# Patient Record
Sex: Female | Born: 1955 | Race: White | Hispanic: No | Marital: Married | Smoking: Former smoker
Health system: Southern US, Community
[De-identification: ages and names within clinical notes are randomized; demographics above are authoritative.]

## PROBLEM LIST (undated history)

## (undated) DIAGNOSIS — H269 Unspecified cataract: Secondary | ICD-10-CM

## (undated) DIAGNOSIS — R011 Cardiac murmur, unspecified: Secondary | ICD-10-CM

## (undated) DIAGNOSIS — K649 Unspecified hemorrhoids: Secondary | ICD-10-CM

## (undated) DIAGNOSIS — K760 Fatty (change of) liver, not elsewhere classified: Secondary | ICD-10-CM

## (undated) DIAGNOSIS — E079 Disorder of thyroid, unspecified: Secondary | ICD-10-CM

## (undated) DIAGNOSIS — I700 Atherosclerosis of aorta: Secondary | ICD-10-CM

## (undated) DIAGNOSIS — G4730 Sleep apnea, unspecified: Secondary | ICD-10-CM

## (undated) DIAGNOSIS — K5790 Diverticulosis of intestine, part unspecified, without perforation or abscess without bleeding: Secondary | ICD-10-CM

## (undated) HISTORY — PX: HYSTEROTOMY: SHX1776

## (undated) HISTORY — DX: Cardiac murmur, unspecified: R01.1

## (undated) HISTORY — PX: NASAL SEPTUM SURGERY: SHX37

## (undated) HISTORY — PX: HERNIA MESH REMOVAL: SHX1745

## (undated) HISTORY — DX: Sleep apnea, unspecified: G47.30

## (undated) HISTORY — PX: HEMORRHOID SURGERY: SHX153

## (undated) HISTORY — PX: SHOULDER ARTHROSCOPY: SHX128

## (undated) HISTORY — DX: Unspecified hemorrhoids: K64.9

## (undated) HISTORY — DX: Unspecified cataract: H26.9

## (undated) HISTORY — PX: CATARACT EXTRACTION, BILATERAL: SHX1313

## (undated) HISTORY — PX: APPENDECTOMY: SHX54

## (undated) HISTORY — PX: CERVICAL FUSION: SHX112

## (undated) HISTORY — DX: Fatty (change of) liver, not elsewhere classified: K76.0

## (undated) HISTORY — DX: Disorder of thyroid, unspecified: E07.9

## (undated) HISTORY — PX: TONSILLECTOMY: SUR1361

## (undated) HISTORY — DX: Diverticulosis of intestine, part unspecified, without perforation or abscess without bleeding: K57.90

## (undated) HISTORY — DX: Atherosclerosis of aorta: I70.0

---

## 2015-05-05 DIAGNOSIS — E063 Autoimmune thyroiditis: Secondary | ICD-10-CM | POA: Insufficient documentation

## 2015-05-05 DIAGNOSIS — I10 Essential (primary) hypertension: Secondary | ICD-10-CM | POA: Insufficient documentation

## 2016-01-01 DIAGNOSIS — G4733 Obstructive sleep apnea (adult) (pediatric): Secondary | ICD-10-CM | POA: Insufficient documentation

## 2017-05-20 DIAGNOSIS — F419 Anxiety disorder, unspecified: Secondary | ICD-10-CM | POA: Insufficient documentation

## 2018-03-26 DIAGNOSIS — R1033 Periumbilical pain: Secondary | ICD-10-CM | POA: Insufficient documentation

## 2018-03-26 DIAGNOSIS — K648 Other hemorrhoids: Secondary | ICD-10-CM | POA: Insufficient documentation

## 2018-03-26 DIAGNOSIS — K6289 Other specified diseases of anus and rectum: Secondary | ICD-10-CM | POA: Insufficient documentation

## 2018-10-13 DIAGNOSIS — M47812 Spondylosis without myelopathy or radiculopathy, cervical region: Secondary | ICD-10-CM | POA: Insufficient documentation

## 2020-01-03 DIAGNOSIS — Z8719 Personal history of other diseases of the digestive system: Secondary | ICD-10-CM | POA: Insufficient documentation

## 2020-01-11 DIAGNOSIS — M5136 Other intervertebral disc degeneration, lumbar region: Secondary | ICD-10-CM | POA: Insufficient documentation

## 2020-02-17 NOTE — Telephone Encounter (Signed)
I would be happy to see her in the office for a routine appointment, thanks

## 2020-02-17 NOTE — Telephone Encounter (Signed)
Left message to call back  

## 2020-04-12 DIAGNOSIS — R109 Unspecified abdominal pain: Secondary | ICD-10-CM

## 2020-04-12 DIAGNOSIS — Z8379 Family history of other diseases of the digestive system: Secondary | ICD-10-CM | POA: Diagnosis not present

## 2020-04-12 DIAGNOSIS — K625 Hemorrhage of anus and rectum: Secondary | ICD-10-CM | POA: Diagnosis not present

## 2020-04-12 DIAGNOSIS — K6289 Other specified diseases of anus and rectum: Secondary | ICD-10-CM

## 2020-04-12 DIAGNOSIS — K760 Fatty (change of) liver, not elsewhere classified: Secondary | ICD-10-CM

## 2020-04-12 DIAGNOSIS — E663 Overweight: Secondary | ICD-10-CM

## 2020-04-12 DIAGNOSIS — Z800 Family history of malignant neoplasm of digestive organs: Secondary | ICD-10-CM

## 2020-04-12 NOTE — Progress Notes (Signed)
HPI :  64 year old female with a history of umbilical hernia repair, internal hemorrhoids/anal fissure, strong family history of IBD and colon cancer, referred by Malva Cogan PA for second opinion regarding rectal pain and abdominal pain.  She states she had an umbilical hernia repair about 3 years ago, and ever since then she has had ongoing abdominal pain from her epigastric to lower abdominal area, but it appears to mostly be around her periumbilical area to lower abdomen.  She states she has some level of discomfort there all the time, it never goes away, but can have fluctuations in the severity of the pain.  It does appear positional at times, sitting up straight can make it feel better.  She did not have any prandial association with her pain.  She does have some belching, denies any pyrosis or reflux symptoms.  No dysphagia.  She eats well.  She does have some discomfort with bowel movements at times.  She denies any NSAID use.  She states her abdomen is quite sensitive to the touch.  She thinks over time her symptoms appear to be getting worse.  She has tried some hyoscyamine but that does not appear to have provided much benefit.  She states she had a remote colonoscopy showing that she had an ulcer in her ileum several years ago but no report of that on file.  I do see that her last colonoscopy was in December 2017 done by Dr. Lorne Skeens in [place], she was noted to have bowel prep, BPPS score of 8, good prep.  She was noted to have external hemorrhoids and perianal skin tags as well as moderate diverticulosis, no polyps.  She was told to repeat the exam in 5 years for screening purposes.  Most recently she had a CT scan in May 2021 as outlined below for this issue, no acute pathology was noted.  She was noted to have fatty liver.  She denies any alcohol use.  She also has been having problems with rectal discomfort ongoing for the past few years.  She states she had surgery for  hemorrhoid several years ago, and then had hemorrhoid banding 2 years ago when symptoms started.  She states this did not help her symptoms at all.  She was told she had anal fissures as well.  She has been using Anusol suppositories as well as nifedipine ointment, she states these have not provided any benefit to her.  She states she has rectal discomfort every time she has a bowel movement.  She has a bowel movement about once to twice per day, she has occasional blood in her stools.  Pain can last up towards hours at a time after bowel movement.  She is getting frustrated with this issue.  Brother with colon cancer, brother with Crohn's disease as well as UC in another brother, colon cancer in maternal grandfather  Colonoscopy 2017--mild external hemorrhoids and perianal skin tags, moderate diverticulosis, otherwise normal colonoscopy, 5-year repeat  EGD 2016--GERD, small hiatal hernia, mild erosive gastritis, otherwise normal EGD, path consistent with chemical gastritis but negative for H. pylori   CT scan abdomen pelvis with IV contrast 02/21/20 - no acute findings, fatty liver, R renal cyst, diverticulosis  Labs show a normal CBC and CMET in 08/2019 (care everywhere)   Past Medical History:  Diagnosis Date  . Aortic atherosclerosis ([place])    CT abd/Pelvis 02-21-20 [facility]  . Diverticulosis    CT abd/Pelvis 02-21-20 [facility]  .  Fatty liver    CT abd/Pelvis 02-21-20 [facility]  . Heart murmur   . Hemorrhoids      Past Surgical History:  Procedure Laterality Date  . APPENDECTOMY    . CATARACT EXTRACTION, BILATERAL    . HEMORRHOID SURGERY    . HERNIA MESH REMOVAL    . HYSTEROTOMY    . NASAL SEPTUM SURGERY    . SHOULDER ARTHROSCOPY    . TONSILLECTOMY     Family History  Problem Relation Age of Onset  . Colon polyps Mother   . Diabetes Mother   . Heart disease Mother   . COPD Father   . Diabetes Father   .  Diabetes Sister   . Cirrhosis Brother   . Crohn's disease Brother   . Diabetes Brother   . Colon cancer Brother        ?  . Colon cancer Maternal Grandmother   . Ulcerative colitis Brother    Social History   Tobacco Use  . Smoking status: Former Research scientist (life sciences)  . Smokeless tobacco: Never Used  Substance Use Topics  . Alcohol use: Not Currently  . Drug use: Never   Current Outpatient Medications  Medication Sig Dispense Refill  . ALPRAZolam (XANAX) 0.5 MG tablet Take 1 tablet by mouth 3 (three) times daily as needed.    . Calcium Carb-Cholecalciferol 500-400 MG-UNIT CHEW Chew 1 tablet by mouth daily.    . Cholecalciferol 50 MCG (2000 UT) CAPS Take 1 capsule by mouth daily.    Mariane Baumgarten Sodium (DSS) 100 MG CAPS Take 1 capsule by mouth as needed.    Marland Kitchen Hyoscyamine Sulfate SL 0.125 MG SUBL Take 1 tablet by mouth every 4 (four) hours as needed.    . Omega 3 1000 MG CAPS Take 1 capsule by mouth daily.    . Sodium Sulfate-Mag Sulfate-KCl (SUTAB) [id number] MG TABS Take 1 kit by mouth once for 1 dose. BIN: [id number] PCN: CN GROUP: [id number] MEMBER ID: [id number];DZH AS CASH 24 tablet 0   No current facility-administered medications for this visit.   Allergies  Allergen Reactions  . Amoxicillin-Pot Clavulanate Itching and Rash  . Clarithromycin Rash  . Doxycycline Nausea Only and Other (See Comments)    Abdominal pain   . Levothyroxine Swelling  . Lisdexamfetamine Dimesylate Swelling    Difficulty swallowing. Allergy could not be confirmed, but patient suspects  . Pentasa [Mesalamine] Other (See Comments)    Chills , aches and neck pain  . Cephalexin Other (See Comments)    Makes me feel like I'm speed  . Sulfa Antibiotics Rash     Review of Systems: All systems reviewed and negative except where noted in HPI.    Recent labs  Reviewed in Care everywhere as above  Physical Exam: BP (!) 152/84   Pulse 64   Ht 5' 1"  (1.549 m)   Wt 177 lb (80.3 kg)   BMI 33.44 kg/m    Constitutional: Pleasant,well-developed, female in no acute distress. HEENT: Normocephalic and atraumatic. Conjunctivae are normal. No scleral icterus. Neck supple.  Cardiovascular: Normal rate, regular rhythm.  Pulmonary/chest: Effort normal and breath sounds normal. No wheezing, rales or rhonchi. Abdominal: Soft, nondistended, focal periumbilical tenderness and to mid lower abdomen, (+) carnett sign  There are no masses palpable.  DRE / Anoscopy - CMA June [place] standby - no fissure, internal hemorrhoids noted, no mass lesions Extremities: no edema Lymphadenopathy: No cervical adenopathy noted. Neurological: Alert and oriented to person place and time. Skin:  Skin is warm and dry. No rashes noted. Psychiatric: Normal mood and affect. Behavior is normal.   ASSESSMENT AND PLAN: 64 year old female here for new patient assessment of the following:  Abdominal pain - as above, chronic ongoing pain since periumbilical hernia repair a few years ago.  She is quite sensitive to touch on exam with a positive Carnett's sign.  Her labs are normal and imaging does not show a clear abnormality.  No obvious evidence of recurrent hernia on exam today.  I suspect she may have neuropathic pain following her surgery (perhaps anterior cutaneous entrapment syndrome).  I discussed what this is with her.  She has multiple sites of pain that are focal and she can localize.  While I discussed that this is the most likely diagnosis for her pain, in light of her rectal pain and bleeding and strong family history of IBD, I did offer her a colonoscopy to ensure no evidence of luminal Crohn's disease.  We discussed risk and benefits that she wanted to proceed. If we do not find any cause for her pain on colonoscopy then I will consider trigger point injection of the abdominal wall versus trial of gabapentin or other pain modulator.  She was in agreement with the plan.  In the interim, she will try some Pepcid to see if this  helps any epigastric component of her symptoms however prior EGD negative, she has not tolerated PPIs in the past, I think PUD and gastritis is probably less likely.  Rectal pain / rectal bleeding - symptoms sound like that of an anal fissure however I could not appreciate an obvious fissure on exam today, and treatment of a suspected fissure in the past has not provided any benefit.  Anoscopy done and she does have internal hemorrhoids however none obviously inflamed.  As above we will proceed with colonoscopy to exclude other causes, if nothing found may empirically give her topical nitroglycerin ointment for possible spasm.  She agreed  Fatty liver - I counseled her on spectrum of fatty liver disease and risks for Karlene Lineman and cirrhosis.  She does not drink alcohol.  Her liver enzymes are normal, encouraged her regarding weight loss which may help prevent complications from this and may also help her abdominal pain as well.  She asked for assistance with this and will refer her to a weight loss clinic.  She can follow-up with [place] once yearly for fatty liver  Randalia Cellar, MD [facility]

## 2020-04-12 NOTE — Patient Instructions (Addendum)
If you are age 66 or older, your body mass index should be between 23-30. Your There is no height or weight on file to calculate BMI. If this is out of the aforementioned range listed, please consider follow up with your Primary Care Provider.  If you are age 29 or younger, your body mass index should be between 19-25. Your There is no height or weight on file to calculate BMI. If this is out of the aformentioned range listed, please consider follow up with your Primary Care Provider.    You have been scheduled for a colonoscopy. Please follow written instructions given to you at your visit today.  Please pick up your prep supplies at the pharmacy within the next 1-3 days. If you use inhalers (even only as needed), please bring them with you on the day of your procedure.  Please purchase the following medications over the counter and take as directed: Pepcid 20 mg: Take as needed   We will refer you to the [facility].  They will contact you to schedule an appointment.    Thank you for entrusting me with your care and for choosing [facility], Dr. Ileene Patrick

## 2020-04-27 DIAGNOSIS — Z800 Family history of malignant neoplasm of digestive organs: Secondary | ICD-10-CM

## 2020-04-27 DIAGNOSIS — Z8379 Family history of other diseases of the digestive system: Secondary | ICD-10-CM

## 2020-04-27 DIAGNOSIS — K5730 Diverticulosis of large intestine without perforation or abscess without bleeding: Secondary | ICD-10-CM | POA: Diagnosis not present

## 2020-04-27 DIAGNOSIS — K5790 Diverticulosis of intestine, part unspecified, without perforation or abscess without bleeding: Secondary | ICD-10-CM | POA: Insufficient documentation

## 2020-04-27 DIAGNOSIS — K625 Hemorrhage of anus and rectum: Secondary | ICD-10-CM | POA: Diagnosis present

## 2020-04-27 DIAGNOSIS — R109 Unspecified abdominal pain: Secondary | ICD-10-CM

## 2020-04-27 DIAGNOSIS — K649 Unspecified hemorrhoids: Secondary | ICD-10-CM

## 2020-04-27 MED ORDER — SODIUM CHLORIDE 0.9 % IV SOLN
500.0000 mL | Freq: Once | INTRAVENOUS | Status: DC
Start: 2020-04-27 — End: 2020-04-27

## 2020-04-27 NOTE — Progress Notes (Signed)
To PACU, VSS. Report to [email or web address] 

## 2020-04-27 NOTE — Patient Instructions (Addendum)
HANDOUTS PROVIDED ON: Diverticulosis, Hemorrhoids and banding for Hemorrhoids  You may resume your previous diet and medication schedule.  Trial an over the counter daily fiber supplement if not yet tried, for hemorrhoids  Thank you for allowing [place] to care for you today!!!   YOU HAD AN ENDOSCOPIC PROCEDURE TODAY AT THE [facility]:   Refer to the procedure report that was given to you for any specific questions about what was found during the examination.  If the procedure report does not answer your questions, please call your gastroenterologist to clarify.  If you requested that your care partner not be given the details of your procedure findings, then the procedure report has been included in a sealed envelope for you to review at your convenience later.  YOU SHOULD EXPECT: Some feelings of bloating in the abdomen. Passage of more gas than usual.  Walking can help get rid of the air that was put into your GI tract during the procedure and reduce the bloating. If you had a lower endoscopy (such as a colonoscopy or flexible sigmoidoscopy) you may notice spotting of blood in your stool or on the toilet paper. If you underwent a bowel prep for your procedure, you may not have a normal bowel movement for a few days.  Please Note:  You might notice some irritation and congestion in your nose or some drainage.  This is from the oxygen used during your procedure.  There is no need for concern and it should clear up in a day or so.  SYMPTOMS TO REPORT IMMEDIATELY:   Following lower endoscopy (colonoscopy or flexible sigmoidoscopy):  Excessive amounts of blood in the stool  Significant tenderness or worsening of abdominal pains  Swelling of the abdomen that is new, acute  Fever of 100F or higher    For urgent or emergent issues, a gastroenterologist can be reached at any hour by calling [phone]. Do not use MyChart messaging for urgent concerns.    DIET:  We do recommend a  small meal at first, but then you may proceed to your regular diet.  Drink plenty of fluids but you should avoid alcoholic beverages for 24 hours.  ACTIVITY:  You should plan to take it easy for the rest of today and you should NOT DRIVE or use heavy machinery until tomorrow (because of the sedation medicines used during the test).    FOLLOW UP: Our staff will call the number listed on your records 48-72 hours following your procedure to check on you and address any questions or concerns that you may have regarding the information given to you following your procedure. If we do not reach you, we will leave a message.  We will attempt to reach you two times.  During this call, we will ask if you have developed any symptoms of COVID 19. If you develop any symptoms (ie: fever, flu-like symptoms, shortness of breath, cough etc.) before then, please call [phone].  If you test positive for Covid 19 in the 2 weeks post procedure, please call and report this information to [place].    If any biopsies were taken you will be contacted by phone or by letter within the next 1-3 weeks.  Please call us at [phone] if you have not heard about the biopsies in 3 weeks.    SIGNATURES/CONFIDENTIALITY: You and/or your care partner have signed paperwork which will be entered into your electronic medical record.  These signatures attest to the fact that that  the information above on your After Visit Summary has been reviewed and is understood.  Full responsibility of the confidentiality of this discharge information lies with you and/or your care-partner.

## 2020-04-27 NOTE — Op Note (Signed)
[facility] Patient Name: Samuella Bruinatricia Coreas Procedure Date: 04/27/2020 10:13 AM MRN: [id number] Endoscopist: Viviann SpareSteven P. Adela LankArmbruster , MD Age: 1968 Referring MD:  Date of Birth: 11/29/1955 Gender: Female Account #: [id number] Procedure:                Colonoscopy Indications:              Abdominal pain, Rectal bleeding, Family history of                            Crohn's disease in a first-degree relative, family                            history of colon cancer Medicines:                Monitored Anesthesia Care Procedure:                Pre-Anesthesia Assessment:                           - Prior to the procedure, a History and Physical                            was performed, and patient medications and                            allergies were reviewed. The patient's tolerance of                            previous anesthesia was also reviewed. The risks                            and benefits of the procedure and the sedation                            options and risks were discussed with the patient.                            All questions were answered, and informed consent                            was obtained. Prior Anticoagulants: The patient has                            taken no previous anticoagulant or antiplatelet                            agents. ASA Grade Assessment: II - A patient with                            mild systemic disease. After reviewing the risks                            and benefits, the patient was deemed in  satisfactory condition to undergo the procedure.                           After obtaining informed consent, the colonoscope                            was passed under direct vision. Throughout the                            procedure, the patient's blood pressure, pulse, and                            oxygen saturations were monitored continuously. The                            Colonoscope was  introduced through the anus and                            advanced to the the terminal ileum, with                            identification of the appendiceal orifice and IC                            valve. The colonoscopy was performed without                            difficulty. The patient tolerated the procedure                            well. The quality of the bowel preparation was                            good. The terminal ileum, ileocecal valve,                            appendiceal orifice, and rectum were photographed. Scope In: 10:19:33 AM Scope Out: 10:38:01 AM Scope Withdrawal Time: 0 hours 13 minutes 34 seconds  Total Procedure Duration: 0 hours 18 minutes 28 seconds  Findings:                 The perianal and digital rectal examinations were                            normal.                           The terminal ileum appeared normal.                           A few small-mouthed diverticula were found in the                            left colon and right colon.  Internal hemorrhoids were found during                            retroflexion, with scarring from prior                            hemorrhoidectomy or banding                           The exam was otherwise without abnormality. No                            polyps. No evidence of Crohn's. No anal fissure Complications:            No immediate complications. Estimated blood loss:                            None. Estimated Blood Loss:     Estimated blood loss: none. Impression:               - The examined portion of the ileum was normal.                           - Diverticulosis in the left colon and in the right                            colon.                           - Internal hemorrhoids.                           - The examination was otherwise normal.                           - No polyps, evidence of IBD, or anal fissure.                           No cause for  abdominal pain on colonoscopy, no                            evidence of IBD. I think rectal bleeding most                            likely due to hemorrhoids, no evidence of fissure.                            Abdominal pain could be neuropathic / abdominal                            wall in etiology, consideration for trigger point                            injection as outlined in clinic to treat. Recommendation:           - Patient has a contact number  available for                            emergencies. The signs and symptoms of potential                            delayed complications were discussed with the                            patient. Return to normal activities tomorrow.                            Written discharge instructions were provided to the                            patient.                           - Resume previous diet.                           - Continue present medications.                           - Trial of daily fiber supplement if not tried yet,                            consideration for banding if bleeding symptoms                            persist                           - Repeat colonoscopy in 5 years for screening                            purposes. Viviann Spare P. Adalyne Lovick, MD 04/27/2020 10:44:33 AM This report has been signed electronically.

## 2020-05-01 NOTE — Telephone Encounter (Signed)
Attempted f/u phone call. No answer, left message.  

## 2020-05-01 NOTE — Telephone Encounter (Signed)
  Follow up Call-  Call back number 04/27/2020  Post procedure Call Back phone  # [phone]  Permission to leave phone message Yes  Some recent data might be hidden     Patient questions:  Do you have a fever, pain , or abdominal swelling? No. Pain Score  0 *  Have you tolerated food without any problems? Yes.    Have you been able to return to your normal activities? Yes.    Do you have any questions about your discharge instructions: Diet   No. Medications  No. Follow up visit  No.  Do you have questions or concerns about your Care? No.  Actions: * If pain score is 4 or above: No action needed, pain <4.  1. Have you developed a fever since your procedure? no  2.   Have you had an respiratory symptoms (SOB or cough) since your procedure? no  3.   Have you tested positive for COVID 19 since your procedure no  4.   Have you had any family members/close contacts diagnosed with the COVID 19 since your procedure?  no   If yes to any of these questions please route to Laverna Peace, RN and Charlett Lango, RN

## 2020-06-08 DIAGNOSIS — K641 Second degree hemorrhoids: Secondary | ICD-10-CM

## 2020-06-08 MED ORDER — DICYCLOMINE HCL 10 MG PO CAPS
10.0000 mg | ORAL_CAPSULE | Freq: Three times a day (TID) | ORAL | 3 refills | Status: DC | PRN
Start: 2020-06-08 — End: 2021-11-22

## 2020-06-08 NOTE — Progress Notes (Signed)
  64 year old female here for a follow-up visit for hemorrhoid banding.  She has had symptoms of hemorrhoids intermittently to include bleeding, irritation and swelling.  She has occasional prolapse.  She has had a remote surgery for hemorrhoid she thinks, and banding a few years ago.  These seem to have helped but symptoms have recurred.  She is using some suppositories at home but symptoms continue to occur periodically.  Colonoscopy done in July showed internal hemorrhoids as the likely cause for her symptoms.  In regards to her abdominal pain, she had been using Levsin as needed which she states calms her down a little bit but she has some blurred vision as a side effect.  She inquires about other options.  We discussed switching Levsin to Bentyl to see if she tolerates that better.  Following the discussion of the risks and benefits of banding she wanted to proceed.    Colonoscopy 04/27/20 - The perianal and digital rectal examinations were normal. - The terminal ileum appeared normal. - A few small-mouthed diverticula were found in the left colon and right colon. - Internal hemorrhoids were found during retroflexion, with scarring from prior hemorrhoidectomy or banding - The exam was otherwise without abnormality. No polyps. No evidence of Crohn's. No anal fissure  PROCEDURE NOTE: The patient presents with symptomatic grade II  hemorrhoids, requesting rubber band ligation of his/her hemorrhoidal disease.  All risks, benefits and alternative forms of therapy were described and informed consent was obtained.   The anorectum was pre-medicated with 0.125% nitroglycerin The decision was made to band the LL internal hemorrhoid, and the [facility] System was used to perform band ligation without complication.  Digital anorectal examination was then performed to assure proper positioning of the band, and to adjust the banded tissue as required.  The patient was discharged home without pain or  other issues.  Dietary and behavioral recommendations were given and along with follow-up instructions.     The patient will return in 2-4 weeks for  follow-up and possible additional banding as required. No complications were encountered and the patient tolerated the procedure well.  Of note, patient has had some chronic abdominal discomfort. Not bothering her as much lately, we have suspected neuropathic / musculoskeletal however levsin does provide some benefit, although she states makes her have blurry vision. Will stop the levsin, see if she tolerates bentyl better. She can follow up for this as needed otherwise.  Glenda Patrick, MD [facility]

## 2020-06-08 NOTE — Patient Instructions (Signed)
If you are age 66 or older, your body mass index should be between 23-30. Your Body mass index is 32.5 kg/m. If this is out of the aforementioned range listed, please consider follow up with your Primary Care Provider.  If you are age 77 or younger, your body mass index should be between 19-25. Your Body mass index is 32.5 kg/m. If this is out of the aformentioned range listed, please consider follow up with your Primary Care Provider.   HEMORRHOID BANDING PROCEDURE    FOLLOW-UP CARE   1. The procedure you have had should have been relatively painless since the banding of the area involved does not have nerve endings and there is no pain sensation.  The rubber band cuts off the blood supply to the hemorrhoid and the band may fall off as soon as 48 hours after the banding (the band may occasionally be seen in the toilet bowl following a bowel movement). You may notice a temporary feeling of fullness in the rectum which should respond adequately to plain Tylenol or Motrin.  2. Following the banding, avoid strenuous exercise that evening and resume full activity the next day.  A sitz bath (soaking in a warm tub) or bidet is soothing, and can be useful for cleansing the area after bowel movements.     3. To avoid constipation, take two tablespoons of natural wheat bran, natural oat bran, flax, Benefiber or any over the counter fiber supplement and increase your water intake to 7-8 glasses daily.    4. Unless you have been prescribed anorectal medication, do not put anything inside your rectum for two weeks: No suppositories, enemas, fingers, etc.  5. Occasionally, you may have more bleeding than usual after the banding procedure.  This is often from the untreated hemorrhoids rather than the treated one.  Dont be concerned if there is a tablespoon or so of blood.  If there is more blood than this, lie flat with your bottom higher than your head and apply an ice pack to the area. If the bleeding  does not stop within a half an hour or if you feel faint, call our office at [phone] or go to the emergency room.  6. Problems are not common; however, if there is a substantial amount of bleeding, severe pain, chills, fever or difficulty passing urine (very rare) or other problems, you should call us at [phone] or report to the nearest emergency room.  7. Do not stay seated continuously for more than 2-3 hours for a day or two after the procedure.  Tighten your buttock muscles 10-15 times every two hours and take 10-15 deep breaths every 1-2 hours.  Do not spend more than a few minutes on the toilet if you cannot empty your bowel; instead re-visit the toilet at a later time.   Your next banding appointment is scheduled for Wednesday, 10-13 at 4:00pm. If you need to reschedule this appointment please call as soon as possible: [phone].  We have sent the following medications to your pharmacy for you to pick up at your convenience: Bentyl 10 mg: Take 1 tablet by mouth every 8 hours as needed   Thank you for entrusting me with your care and for choosing [facility], Dr. Ileene Patrick

## 2020-08-08 DIAGNOSIS — K641 Second degree hemorrhoids: Secondary | ICD-10-CM | POA: Diagnosis not present

## 2020-08-08 NOTE — Patient Instructions (Signed)
If you are age 66 or older, your body mass index should be between 23-30. Your Body mass index is 33.44 kg/m. If this is out of the aforementioned range listed, please consider follow up with your Primary Care Provider.  If you are age 9 or younger, your body mass index should be between 19-25. Your Body mass index is 33.44 kg/m. If this is out of the aformentioned range listed, please consider follow up with your Primary Care Provider.   HEMORRHOID BANDING PROCEDURE    FOLLOW-UP CARE   1. The procedure you have had should have been relatively painless since the banding of the area involved does not have nerve endings and there is no pain sensation.  The rubber band cuts off the blood supply to the hemorrhoid and the band may fall off as soon as 48 hours after the banding (the band may occasionally be seen in the toilet bowl following a bowel movement). You may notice a temporary feeling of fullness in the rectum which should respond adequately to plain Tylenol or Motrin.  2. Following the banding, avoid strenuous exercise that evening and resume full activity the next day.  A sitz bath (soaking in a warm tub) or bidet is soothing, and can be useful for cleansing the area after bowel movements.     3. To avoid constipation, take two tablespoons of natural wheat bran, natural oat bran, flax, Benefiber or any over the counter fiber supplement and increase your water intake to 7-8 glasses daily.    4. Unless you have been prescribed anorectal medication, do not put anything inside your rectum for two weeks: No suppositories, enemas, fingers, etc.  5. Occasionally, you may have more bleeding than usual after the banding procedure.  This is often from the untreated hemorrhoids rather than the treated one.  Don't be concerned if there is a tablespoon or so of blood.  If there is more blood than this, lie flat with your bottom higher than your head and apply an ice pack to the area. If the bleeding  does not stop within a half an hour or if you feel faint, call our office at [phone] or go to the emergency room.  6. Problems are not common; however, if there is a substantial amount of bleeding, severe pain, chills, fever or difficulty passing urine (very rare) or other problems, you should call us at [phone] or report to the nearest emergency room.  7. Do not stay seated continuously for more than 2-3 hours for a day or two after the procedure.  Tighten your buttock muscles 10-15 times every two hours and take 10-15 deep breaths every 1-2 hours.  Do not spend more than a few minutes on the toilet if you cannot empty your bowel; instead re-visit the toilet at a later time.    You are scheduled for a 3rd banding appointment on Thursday, 09-04-20 at 4:00pm   Thank you for entrusting me with your care and for choosing [facility], Dr. Ileene Patrick

## 2020-08-08 NOTE — Progress Notes (Signed)
64 year old female here for a follow-up visit for hemorrhoid banding.  She has had symptoms of hemorrhoids intermittently to include bleeding, irritation and swelling.  She has occasional prolapse.  Colonoscopy done in July showed internal hemorrhoids as the likely cause for her symptoms. LL banded on 06/08/20 - she reported a significant improvement in her symptoms and tolerated it well, she wishes to continue with therapy at this time.   Colonoscopy 04/27/20 - The perianal and digital rectal examinations were normal. - The terminal ileum appeared normal. - A few small-mouthed diverticula were found in the left colon and right colon. - Internal hemorrhoids were found during retroflexion, with scarring from prior hemorrhoidectomy or banding - The exam was otherwise without abnormality. No polyps. No evidence of Crohn's. No anal fissure  PROCEDURE NOTE: The patient presents with symptomatic grade II  hemorrhoids, requesting rubber band ligation of his/her hemorrhoidal disease. All risks, benefits and alternative forms of therapy were described and informed consent was obtained.   The anorectum was pre-medicated with 0.125% nitroglycerin The decision was made to band the RP internal hemorrhoid, and the [facility] O'Regan System was used to perform band ligation without complication. Digital anorectal examination was then performed to assure proper positioning of the band, and to adjust the banded tissue as required.  The patient was discharged home without pain or other issues. Dietary and behavioral recommendations were given and along with follow-up instructions.    The patient will return in 2-4 weeks for  follow-up and possible additional banding as required. No complications were encountered and the patient tolerated the procedure well.  Ileene Patrick, MD [facility]

## 2020-09-14 DIAGNOSIS — K641 Second degree hemorrhoids: Secondary | ICD-10-CM | POA: Diagnosis not present

## 2020-09-14 NOTE — Patient Instructions (Signed)
If you are age 66 or older, your body mass index should be between 23-30. Your Body mass index is 33.2 kg/m. If this is out of the aforementioned range listed, please consider follow up with your Primary Care Provider.  If you are age 82 or younger, your body mass index should be between 19-25. Your Body mass index is 33.2 kg/m. If this is out of the aformentioned range listed, please consider follow up with your Primary Care Provider.   HEMORRHOID BANDING PROCEDURE    FOLLOW-UP CARE   1. The procedure you have had should have been relatively painless since the banding of the area involved does not have nerve endings and there is no pain sensation.  The rubber band cuts off the blood supply to the hemorrhoid and the band may fall off as soon as 48 hours after the banding (the band may occasionally be seen in the toilet bowl following a bowel movement). You may notice a temporary feeling of fullness in the rectum which should respond adequately to plain Tylenol or Motrin.  2. Following the banding, avoid strenuous exercise that evening and resume full activity the next day.  A sitz bath (soaking in a warm tub) or bidet is soothing, and can be useful for cleansing the area after bowel movements.     3. To avoid constipation, take two tablespoons of natural wheat bran, natural oat bran, flax, Benefiber or any over the counter fiber supplement and increase your water intake to 7-8 glasses daily.    4. Unless you have been prescribed anorectal medication, do not put anything inside your rectum for two weeks: No suppositories, enemas, fingers, etc.  5. Occasionally, you may have more bleeding than usual after the banding procedure.  This is often from the untreated hemorrhoids rather than the treated one.  Don't be concerned if there is a tablespoon or so of blood.  If there is more blood than this, lie flat with your bottom higher than your head and apply an ice pack to the area. If the bleeding  does not stop within a half an hour or if you feel faint, call our office at [phone] or go to the emergency room.  6. Problems are not common; however, if there is a substantial amount of bleeding, severe pain, chills, fever or difficulty passing urine (very rare) or other problems, you should call us at [phone] or report to the nearest emergency room.  7. Do not stay seated continuously for more than 2-3 hours for a day or two after the procedure.  Tighten your buttock muscles 10-15 times every two hours and take 10-15 deep breaths every 1-2 hours.  Do not spend more than a few minutes on the toilet if you cannot empty your bowel; instead re-visit the toilet at a later time.      Thank you for entrusting me with your care and for choosing [facility], Dr. Ileene Patrick

## 2020-09-14 NOTE — Progress Notes (Signed)
64 year old female here for a follow-up visit for hemorrhoid banding. She has had symptoms of hemorrhoids intermittently to include bleeding, irritation and swelling. She has occasional prolapse. Colonoscopy done in July showed internal hemorrhoids as the likely cause for her symptoms. LL banded on 06/08/20 and  RP banded on 11/9 - she reported a significant improvement in her symptoms and tolerated it well, she wishes to continue with therapy at this time after discussion of risks / benefits.  Colonoscopy 04/27/20 -The perianal and digital rectal examinations were normal. - The terminal ileum appeared normal. - A few small-mouthed diverticula were found in the left colon and right colon. - Internal hemorrhoids were found during retroflexion, with scarring from prior hemorrhoidectomy or banding - The exam was otherwise without abnormality. No polyps. No evidence of Crohn's. No anal fissure  PROCEDURE NOTE: The patient presents with symptomatic gradeIIhemorrhoids, requesting rubber band ligation of his/her hemorrhoidal disease.All risks, benefits and alternative forms of therapy were described and informed consent was obtained.   The anorectum was pre-medicated with0.125% nitroglycerin The decision was made to band theRAinternal hemorrhoid, and the [facility] O'Regan System was used to perform band ligation without complication. Digital anorectal examination was then performed to assure proper positioning of the band, and to adjust the banded tissue as required. The patient was discharged home without pain or other issues.Dietary and behavioral recommendations were given and along with follow-up instructions.  The patient will return as needed moving forward. No additional plans for banding at this time. No complications were encountered and the patient tolerated the procedure well.  Glenda Patrick, MD [facility]

## 2021-09-03 DIAGNOSIS — R1031 Right lower quadrant pain: Secondary | ICD-10-CM

## 2021-09-03 DIAGNOSIS — R102 Pelvic and perineal pain: Secondary | ICD-10-CM

## 2021-09-03 DIAGNOSIS — G8929 Other chronic pain: Secondary | ICD-10-CM | POA: Diagnosis not present

## 2021-09-03 NOTE — Progress Notes (Signed)
Subjective:    Patient ID: Glenda Sanchez, female    DOB: May 03, 1956, 65 y.o.   MRN: [id number]  HPI Glenda Sanchez is a pleasant 65 year old white female, established with Dr. Havery Moros, with history of internal hemorrhoids, diverticulosis, family history of colon cancer, previous umbilical hernia repair and hepatic steatosis. She was last seen in July 2021.  She underwent colonoscopy July 2021 with finding of a few diverticuli in the left and right colon, and internal hemorrhoids, no polyps noted and indicated for 5-year interval follow-up. She had 3 hemorrhoidal banding sessions summer 2021 as well. She comes in today after an acute episode onset on Thursday of last week with what felt like rectal and pelvic pressure with urgency for bowel movements but says she was unable to pass any stool.  She describes severe pain/discomfort with this episode.  She eventually manually removed some stool.  Since that time she is continued to have lower abdominal pain, some pain into the left mid quadrant and left lower quadrant, sensation of rectal and pelvic pressure.  She has been able to have bowel movements, she may see no scant amount of blood on the stool x1.  No associated fever or chills.  She has noted some mild increase in lower abdominal discomfort postprandially.  She feels some pressure on her bladder but no urgency frequency or hematuria.  She has never had similar symptoms in the past.  Review of Systems.Pertinent positive and negative review of systems were noted in the above HPI section.  All other review of systems was otherwise negative.   Outpatient Encounter Medications as of 09/03/2021  Medication Sig   Calcium Carb-Cholecalciferol 500-400 MG-UNIT CHEW Chew 1 tablet by mouth daily.   Cholecalciferol 50 MCG (2000 UT) CAPS Take 1 capsule by mouth daily.   ciprofloxacin (CIPRO) 500 MG tablet Take 1 tablet (500 mg total) by mouth 2 (two) times daily for 10 days.   dicyclomine (BENTYL)  10 MG capsule Take 1 capsule (10 mg total) by mouth every 8 (eight) hours as needed for spasms.   metroNIDAZOLE (FLAGYL) 500 MG tablet Take 1 tablet (500 mg total) by mouth 2 (two) times daily for 10 days.   Omega 3 1000 MG CAPS Take 1 capsule by mouth daily.   No facility-administered encounter medications on file as of 09/03/2021.   Allergies  Allergen Reactions   Amoxicillin-Pot Clavulanate Itching and Rash   Clarithromycin Rash   Doxycycline Nausea Only and Other (See Comments)    Abdominal pain    Levothyroxine Swelling   Lisdexamfetamine Dimesylate Swelling    Difficulty swallowing. Allergy could not be confirmed, but patient suspects   Pentasa [Mesalamine] Other (See Comments)    Chills , aches and neck pain   Cephalexin Other (See Comments)    Makes me feel like I'm speed   Sulfa Antibiotics Rash   There are no problems to display for this patient.  Social History   Socioeconomic History   Marital status: Married    Spouse name: Not on file   Number of children: Not on file   Years of education: Not on file   Highest education level: Not on file  Occupational History   Not on file  Tobacco Use   Smoking status: Former   Smokeless tobacco: Never  Vaping Use   Vaping Use: Never used  Substance and Sexual Activity   Alcohol use: Not Currently   Drug use: Never   Sexual activity: Not on file  Other Topics  Concern   Not on file  Social History Narrative   Not on file   Social Determinants of Health   Financial Resource Strain: Not on file  Food Insecurity: Not on file  Transportation Needs: Not on file  Physical Activity: Not on file  Stress: Not on file  Social Connections: Not on file  Intimate Partner Violence: Not on file    Ms. Bochenek's family history includes COPD in her father; Cirrhosis in her brother; Colon cancer in her brother and maternal grandmother; Colon polyps in her mother; Crohn's disease in her brother; Diabetes in her brother, father,  mother, and sister; Heart disease in her mother; Ulcerative colitis in her brother.      Objective:    Vitals:   09/03/21 1119  BP: 140/74  Pulse: 67    Physical Exam Well-developed well-nourished older WF  in no acute distress.   [id number]  BMI 30.8  HEENT; nontraumatic normocephalic, EOMI, PE R LA, sclera anicteric. Oropharynx;not examined Neck; supple, no JVD Cardiovascular; regular rate and rhythm with S1-S2, no murmur rub or gallop Pulmonary; Clear bilaterally Abdomen; soft, nondistended, there is tenderness in the left lower quadrant and suprapubic area no guarding or rebound no palpable mass or hepatosplenomegaly, bowel sounds are active Rectal; no external lesions noted, on anoscopy 1 very small internal hemorrhoid, some friability no palpable or visible fissure Skin; benign exam, no jaundice rash or appreciable lesions Extremities; no clubbing cyanosis or edema skin warm and dry Neuro/Psych; alert and oriented x4, grossly nonfocal mood and affect appropriate        Assessment & Plan:   #62 65 year old white female with 4 to 5-day history of pelvic, rectal and left lower abdominal pain/pressure.  At onset of symptoms had rectal/pelvic pain with inability to pass stool. Suspect symptoms are secondary to diverticulitis, rule out other pelvic inflammatory process, doubt ureterolithiasis but cannot rule out.  #2 small symptomatic internal hemorrhoid-patient is status post hemorrhoidal banding times 11/2019 July  #3 diverticulosis left and right colon at recent colonoscopy July 2021 #4 family history of colon cancer/brother #5 status postumbilical hernia repair #6 hepatic steatosis  Plan; CBC with differential, c-Met, UA Patient will be scheduled for CT of the abdomen pelvis with contrast Start Cipro 500 mg p.o. twice daily x10 days, start metronidazole 500 mg p.o. twice daily x10 days to be taken with food We discussed soft diet/full liquid to soft over the next few days  until feeling better. Offered Anusol HC suppositories, she has a natural rectal suppository that she has been using and will continue that nightly x5 to 7 days, then repeat course as needed Further recommendations pending labs and CT   Alfredia Ferguson PA-C 09/03/2021   Cc: Glenda Aye, PA

## 2021-09-04 NOTE — Progress Notes (Signed)
Agree with assessment and plan as outlined.  

## 2021-09-05 DIAGNOSIS — R102 Pelvic and perineal pain: Secondary | ICD-10-CM | POA: Diagnosis not present

## 2021-09-05 DIAGNOSIS — R1031 Right lower quadrant pain: Secondary | ICD-10-CM

## 2021-09-05 DIAGNOSIS — G8929 Other chronic pain: Secondary | ICD-10-CM

## 2021-09-11 NOTE — Telephone Encounter (Signed)
Patient reports she is feeling a little better She did start the antibiotics and understands to complete them.  She has complaints of a rectal pain described as a pressure. She is using the suppositories you offered as of yesterday. She reports sitting in a sitz bath does "help" with her symptoms. She has a history of back issues and is unsure if that is contributing to her symptoms in the rectal area. Also mentions a hx of an ulcer at the cecum and how this pain reminds her of that. Do you have any suggestions?

## 2021-09-11 NOTE — Telephone Encounter (Signed)
-----   Message from Sammuel Cooper, PA-C sent at 09/07/2021 11:18 AM EST ----- Please call patient and let her know that the CT scan did not show any abnormality in her abdomen to account for her symptoms, there was mild diverticulosis noted in the sigmoid colon but no definite diverticulitis.  I would like to know how she is feeling, and whether she started the antibiotic.  If she has started the antibiotics I would have her complete 7 days, as she may have had very mild diverticulitis.

## 2021-10-10 NOTE — Telephone Encounter (Signed)
Spoke to patient on the phone and offered an MRI-sacrum/Si w & w/o contrast. She stated [facility] and [facility] and her PCP ordered a MRI, But she thanked [place] for the follow-up

## 2021-10-10 NOTE — Telephone Encounter (Signed)
Left message for patient to please call back. 

## 2021-11-19 NOTE — Telephone Encounter (Signed)
Patient called in complaining of a new, rectal lump that is painful. She stated she has a history of hemorrhoids, but is unsure if that is what this is & would like to be seen. Patient has been scheduled to see Marchelle Folks, [place] on 11/22/21 at 10:30 am.

## 2021-11-21 NOTE — Progress Notes (Signed)
11/22/2021 Glenda Sanchez [id number] January 20, 1956   ASSESSMENT AND PLAN:   Abdominal pain, chronic, right lower quadrant Pelvic pain Given information about pelvic floor dysfunction ? Would benefit to do anal manometry/pelvic floor PT  Grade II hemorrhoids -     hydrocortisone (ANUSOL-HC) 25 MG suppository; Place 1 suppository (25 mg total) rectally at bedtime. Use for 5 to 7 days  Will add on fiber, continue magnesium, will give suppository Will set patient up for evaluation with Dr. Adela Lank for possible banding    Future Appointments  Date Time Provider Department Center  12/04/2021  1:30 PM Esterwood, Amy S, PA-C [facility]    History of Present Illness:  66 y.o. female  with a past medical history of  internal hemorrhoids, diverticulosis, family history of colon cancer, previous umbilical hernia repair and hepatic steatosis. and others listed below, known to Dr. Adela Lank returns to clinic today for evaluation of rectal pain. Patient called 11/19/2021 with painful rectal lump.   She had 3 hemorrhoidal banding sessions summer 2021 as well. She has always had issues with her GI, will have sudden constipation, once she can start having BM she can have long hard stool. Has had to manually remove some stool in the past.  Has sensation rectal/pelvic pressure.  Denies vaginal pressure, urinary frequency, urinary incontinence.  Has scant amount of blood on the stool and feels "something on her rectum", once time larger BRB.   She is under a lot of stress with her husband in hospice at home due to sceleroderma.   Previous GI history: 04/27/2020  Colonoscopy diverticula, internal hemorrhoids with scarring from previous hemorrhoidectomy or banding recall 5 years  09/05/21 CT AB pelvis with contrast for AB pain Mild sigmoid diverticulosis. No radiographic evidence of diverticulitis or other acute findings.  Current Medications:        Current Outpatient  Medications (Other):    Calcium Carb-Cholecalciferol 500-400 MG-UNIT CHEW, Chew 1 tablet by mouth daily.   Cholecalciferol 50 MCG (2000 UT) CAPS, Take 1 capsule by mouth daily.   hydrocortisone (ANUSOL-HC) 25 MG suppository, Place 1 suppository (25 mg total) rectally at bedtime. Use for 5 to 7 days   hyoscyamine (LEVSIN SL) 0.125 MG SL tablet, Place 1 tablet (0.125 mg total) under the tongue every 6 (six) hours as needed for cramping.   Omega 3 1000 MG CAPS, Take 1 capsule by mouth daily.  Surgical History:  She  has a past surgical history that includes Hernia mesh removal; Cataract extraction, bilateral; Tonsillectomy; Shoulder arthroscopy; Appendectomy; Hemorrhoid surgery; Nasal septum surgery; Hysterotomy; and Cervical fusion. Family History:  Her family history includes COPD in her father; Cirrhosis in her brother; Colon cancer in her brother and maternal grandmother; Colon polyps in her mother; Crohn's disease in her brother; Diabetes in her brother, father, mother, and sister; Heart disease in her mother; Stomach cancer in her brother; Ulcerative colitis in her brother. Social History:   reports that she has quit smoking. She has never used smokeless tobacco. She reports that she does not currently use alcohol. She reports that she does not use drugs.  Current Medications, Allergies, Past Medical History, Past Surgical History, Family History and Social History were reviewed in [employer] record.  Physical Exam: BP 130/80    Pulse 78    Ht 5\' 1"  (1.549 m)    Wt 171 lb (77.6 kg)    SpO2 98%    BMI 32.31 kg/m  General:   Pleasant, well  developed female in no acute distress Abdomen:  Soft, Obese AB, skin exam normal, Normal bowel sounds. mild tenderness in the RLQ. Without guarding and Without rebound, without hepatomegaly. Rectal: Normal external rectal exam, normal/increased rectal tone, + large appreciated internal hemorrhoids, no masses, non tender, brown stool,  hemoccult negative.  Extremities:  Without edema. Peripheral pulses intact.  Neurologic:  Alert and  oriented x4;  grossly normal neurologically. Skin:   Dry and intact without significant lesions or rashes. Psychiatric: Demonstrates good judgement and reason without abnormal affect or behaviors.  Vladimir Crofts, PA-C 11/22/21

## 2021-11-22 DIAGNOSIS — G8929 Other chronic pain: Secondary | ICD-10-CM

## 2021-11-22 DIAGNOSIS — R1031 Right lower quadrant pain: Secondary | ICD-10-CM

## 2021-11-22 DIAGNOSIS — R102 Pelvic and perineal pain: Secondary | ICD-10-CM | POA: Diagnosis not present

## 2021-11-22 DIAGNOSIS — K641 Second degree hemorrhoids: Secondary | ICD-10-CM

## 2021-11-22 NOTE — Progress Notes (Signed)
Agree with assessment and plan as outlined.  

## 2021-11-22 NOTE — Patient Instructions (Addendum)
We scheduled you an appointment with Dr. Adela Lank for possible hemorrhoid banding on 01/01/22 at 4:40pm  Please do sitz baths, increase fiber or add benefiber, increase water and increase acitivity.  Will send in hydrocoritsone suppository  FIBER SUPPLEMENT  Benefiber or Citracel is good for constipation/diarrhea/irritable bowel syndrome, it helps with weight loss and can help lower your bad cholesterol. Please do 1 TBSP in the morning in water, coffee, or tea. It can take up to a month before you can see a difference with your bowel movements. It is cheapest from [employer], [employer], [employer].   About Hemorrhoids  Hemorrhoids are swollen veins in the lower rectum and anus.  Also called piles, hemorrhoids are a common problem.  Hemorrhoids may be internal (inside the rectum) or external (around the anus).  Internal Hemorrhoids  Internal hemorrhoids are often painless, but they rarely cause bleeding.  The internal veins may stretch and fall down (prolapse) through the anus to the outside of the body.  The veins may then become irritated and painful.  External Hemorrhoids  External hemorrhoids can be easily seen or felt around the anal opening.  They are under the skin around the anus.  When the swollen veins are scratched or broken by straining, rubbing or wiping they sometimes bleed.  How Hemorrhoids Occur  Veins in the rectum and around the anus tend to swell under pressure.  Hemorrhoids can result from increased pressure in the veins of your anus or rectum.  Some sources of pressure are:  Straining to have a bowel movement because of constipation Waiting too long to have a bowel movement Coughing and sneezing often Sitting for extended periods of time, including on the toilet Diarrhea Obesity Trauma or injury to the anus Some liver diseases Stress Family history of hemorrhoids Pregnancy  Pregnant women should try to avoid becoming constipated, because they are more likely to have  hemorrhoids during pregnancy.  In the last trimester of pregnancy, the enlarged uterus may press on blood vessels and causes hemorrhoids.  In addition, the strain of childbirth sometimes causes hemorrhoids after the birth.  Symptoms of Hemorrhoids  Some symptoms of hemorrhoids include: Swelling and/or a tender lump around the anus Itching, mild burning and bleeding around the anus Painful bowel movements with or without constipation Bright red blood covering the stool, on toilet paper or in the toilet bowel.   Symptoms usually go away within a few days.  Always talk to your doctor about any bleeding to make sure it is not from some other causes.  Diagnosing and Treating Hemorrhoids  Diagnosis is made by an examination by your healthcare provider.  Special test can be performed by your doctor.    Most cases of hemorrhoids can be treated with: High-fiber diet: Eat more high-fiber foods, which help prevent constipation.  Ask for more detailed fiber information on types and sources of fiber from your healthcare provider. Fluids: Drink plenty of water.  This helps soften bowel movements so they are easier to pass. Sitz baths and cold packs: Sitting in lukewarm water two or three times a day for 15 minutes cleases the anal area and may relieve discomfort.  If the water is too hot, swelling around the anus will get worse.  Placing a cloth-covered ice pack on the anus for ten minutes four times a day can also help reduce selling.  Gently pushing a prolapsed hemorrhoid back inside after the bath or ice pack can be helpful. Medications: For mild discomfort, your healthcare provider may  suggest over-the-counter pain medication or prescribe a cream or ointment for topical use.  The cream may contain witch hazel, zinc oxide or petroleum jelly.  Medicated suppositories are also a treatment option.  Always consult your doctor before applying medications or creams. Procedures and surgeries: There are also a  number of procedures and surgeries to shrink or remove hemorrhoids in more serious cases.  Talk to your physician about these options.  You can often prevent hemorrhoids or keep them from becoming worse by maintaining a healthy lifestyle.  Eat a fiber-rich diet of fruits, vegetables and whole grains.  Also, drink plenty of water and exercise regularly.   2007, [employer] Doc.30  Here some information about pelvic floor dysfunction. This may be contributing to some of her symptoms. We will continue with our evaluation but I do want you to consider adding on fiber supplement with low-dose MiraLAX daily. We could also refer to pelvic floor physical therapy.   Pelvic Floor Dysfunction, Female Pelvic floor dysfunction (PFD) is a condition that results when the group of muscles and connective tissues that support the organs in the pelvis (pelvic floor muscles) do not work well. These muscles and their connections form a sling that supports the colon and bladder. In women, they also support the uterus. PFD causes pelvic floor muscles to be too weak, too tight, or both. In PFD, muscle movements are not coordinated. This may cause bowel or bladder problems. It may also cause pain. What are the causes? This condition may be caused by an injury to the pelvic area or by a weakening of pelvic muscles. This often results from pregnancy and childbirth or other types of strain. In many cases, the exact cause is not known. What increases the risk? The following factors may make you more likely to develop this condition: Having chronic bladder tissue inflammation (interstitial cystitis). Being an older person. Being overweight. History of radiation treatment for cancer in the pelvic region. Previous pelvic surgery, such as removal of the uterus (hysterectomy). What are the signs or symptoms? Symptoms of this condition vary and may include: Bladder symptoms, such as: Trouble starting urination  and emptying the bladder. Frequent urinary tract infections. Leaking urine when coughing, laughing, or exercising (stress incontinence). Having to pass urine urgently or frequently. Pain when passing urine. Bowel symptoms, such as: Constipation. Urgent or frequent bowel movements. Incomplete bowel movements. Painful bowel movements. Leaking stool or gas. Unexplained genital or rectal pain. Genital or rectal muscle spasms. Low back pain. Other symptoms may include: A heavy, full, or aching feeling in the vagina. A bulge that protrudes into the vagina. Pain during or after sex. How is this diagnosed? This condition may be diagnosed based on: Your symptoms and medical history. A physical exam. During the exam, your health care provider may check your pelvic muscles for tightness, spasm, pain, or weakness. This may include a rectal exam and a pelvic exam. In some cases, you may have diagnostic tests, such as: Electrical muscle function tests. Urine flow testing. X-ray tests of bowel function. Ultrasound of the pelvic organs. How is this treated? Treatment for this condition depends on the symptoms. Treatment options include: Physical therapy. This may include Kegel exercises to help relax or strengthen the pelvic floor muscles. Biofeedback. This type of therapy provides feedback on how tight your pelvic floor muscles are so that you can learn to control them. Internal or external massage therapy. A treatment that involves electrical stimulation of the pelvic floor muscles to help  control pain (transcutaneous electrical nerve stimulation, or TENS). Sound wave therapy (ultrasound) to reduce muscle spasms. Medicines, such as: Muscle relaxants. Bladder control medicines. Surgery to reconstruct or support pelvic floor muscles may be an option if other treatments do not help. Follow these instructions at home: Activity Do your usual activities as told by your health care provider. Ask  your health care provider if you should modify any activities. Do pelvic floor strengthening or relaxing exercises at home as told by your physical therapist. Lifestyle Maintain a healthy weight. Eat foods that are high in fiber, such as beans, whole grains, and fresh fruits and vegetables. Limit foods that are high in fat and processed sugars, such as fried or sweet foods. Manage stress with relaxation techniques such as yoga or meditation. General instructions If you have problems with leakage: Use absorbable pads or wear padded underwear. Wash frequently with mild soap. Keep your genital and anal area as clean and dry as possible. Ask your health care provider if you should try a barrier cream to prevent skin irritation. Take warm baths to relieve pelvic muscle tension or spasms. Take over-the-counter and prescription medicines only as told by your health care provider. Keep all follow-up visits. How is this prevented? The cause of PFD is not always known, but there are a few things you can do to reduce the risk of developing this condition, including: Staying at a healthy weight. Getting regular exercise. Managing stress. Contact a health care provider if: Your symptoms are not improving with home care. You have signs or symptoms of PFD that get worse at home. You develop new signs or symptoms. You have signs of a urinary tract infection, such as: Fever. Chills. Increased urinary frequency. A burning feeling when urinating. You have not had a bowel movement in 3 days (constipation). Summary Pelvic floor dysfunction results when the muscles and connective tissues in your pelvic floor do not work well. These muscles and their connections form a sling that supports your colon and bladder. In women, they also support the uterus. PFD may be caused by an injury to the pelvic area or by a weakening of pelvic muscles. PFD causes pelvic floor muscles to be too weak, too tight, or a  combination of both. Symptoms may vary from person to person. In most cases, PFD can be treated with physical therapies and medicines. Surgery may be an option if other treatments do not help. This information is not intended to replace advice given to you by your health care provider. Make sure you discuss any questions you have with your health care provider. Document Revised: 01/24/2021 Document Reviewed: 01/24/2021 Elsevier Patient Education  2022 [employer].

## 2022-01-01 DIAGNOSIS — K640 First degree hemorrhoids: Secondary | ICD-10-CM

## 2022-01-01 MED ORDER — CITRUCEL PO POWD
1.0000 | Freq: Every day | ORAL | Status: AC
Start: 2022-01-01 — End: ?

## 2022-01-01 NOTE — Patient Instructions (Addendum)
If you are age 68 or older, your body mass index should be between 23-30. Your Body mass index is 32.88 kg/m?Marland Kitchen If this is out of the aforementioned range listed, please consider follow up with your Primary Care Provider. ? ?If you are age 17 or younger, your body mass index should be between 19-25. Your Body mass index is 32.88 kg/m?Marland Kitchen If this is out of the aformentioned range listed, please consider follow up with your Primary Care Provider.  ? ?________________________________________________________ ? ?The [facility] providers would like to encourage you to use [facility] to communicate with providers for non-urgent requests or questions.  Due to long hold times on the telephone, sending your provider a message by [facility] may be a faster and more efficient way to get a response.  Please allow 48 business hours for a response.  Please remember that this is for non-urgent requests.  ?_______________________________________________________ ? ?Please purchase the following medications over the counter and take as directed: ?Citrucel: Take once daily ? ? ? ?HEMORRHOID BANDING PROCEDURE  ? ? FOLLOW-UP CARE ? ? ?The procedure you have had should have been relatively painless since the banding of the area involved does not have nerve endings and there is no pain sensation.  The rubber band cuts off the blood supply to the hemorrhoid and the band may fall off as soon as 48 hours after the banding (the band may occasionally be seen in the toilet bowl following a bowel movement). You may notice a temporary feeling of fullness in the rectum which should respond adequately to plain Tylenol? or Motrin?. ? ?Following the banding, avoid strenuous exercise that evening and resume full activity the next day.  A sitz bath (soaking in a warm tub) or bidet is soothing, and can be useful for cleansing the area after bowel movements.   ? ? ?To avoid constipation, take two tablespoons of natural wheat bran, natural oat bran, flax,  Benefiber? or any over the counter fiber supplement and increase your water intake to 7-8 glasses daily.   ? ?Unless you have been prescribed anorectal medication, do not put anything inside your rectum for two weeks: No suppositories, enemas, fingers, etc. ? ?Occasionally, you may have more bleeding than usual after the banding procedure.  This is often from the untreated hemorrhoids rather than the treated one.  Don?t be concerned if there is a tablespoon or so of blood.  If there is more blood than this, lie flat with your bottom higher than your head and apply an ice pack to the area. If the bleeding does not stop within a half an hour or if you feel faint, call our office at [phone] or go to the emergency room. ? ?Problems are not common; however, if there is a substantial amount of bleeding, severe pain, chills, fever or difficulty passing urine (very rare) or other problems, you should call us at [phone] or report to the nearest emergency room. ? ?Do not stay seated continuously for more than 2-3 hours for a day or two after the procedure.  Tighten your buttock muscles 10-15 times every two hours and take 10-15 deep breaths every 1-2 hours.  Do not spend more than a few minutes on the toilet if you cannot empty your bowel; instead re-visit the toilet at a later time. ? ? We have scheduled you for another banding appointment on Tuesday, 5-16 at 3:40 pm. ? ?Thank you for entrusting me with your care and for choosing [employer], ?  Dr. [place] Cellar ? ? ?

## 2022-01-01 NOTE — Progress Notes (Signed)
? ?  HPI :  ?66 y.o. female  with a past medical history of  internal hemorrhoids, diverticulosis, family history of colon cancer, previous umbilical hernia repair and hepatic steatosis. and others listed below,  ? ?She is here specifically for hemorrhoid banding today.  She has been having some irritated hemorrhoids that cause her discomfort and irritation with her bowel movements.  She has had a bowel movement every day but stool can be hard to pass at times and having straining.  She states she is been on Metamucil in the past which caused bloating, she is not taking anything for her bowels at this time.  She has occasional bleeding, no prolapse noted.  She thinks symptoms are consistent with prior hemorrhoid symptoms she has had.  Recall she has had hemorrhoid banding in 2021 which took care of this, she states she feels hemorrhoid on the right side that is bothering her.  Colonoscopy in 2021 did not show any concerning findings.  We discussed options today after her hemorrhoid banding after her physical exam failed to show any other cause for her symptoms.  She had no anal fissure.  I discussed options for hemorrhoid treatment and she wants to proceed with the banding today to see if this could benefit her. ? ?Previous GI history: ?04/27/2020  Colonoscopy diverticula, internal hemorrhoids with scarring from previous hemorrhoidectomy or banding recall 5 years ?  ?09/05/21 CT AB pelvis with contrast for AB pain ?Mild sigmoid diverticulosis. No radiographic evidence of ?diverticulitis or other acute findings ? ?Hemorrhoids banded 05/2020, 07/2020, 08/2020  ? ?PROCEDURE NOTE: ?The patient presents with symptomatic grade I  hemorrhoids, requesting rubber band ligation of his/her hemorrhoidal disease.  All risks, benefits and alternative forms of therapy were described and informed consent was obtained. CMA Lucius Conn as standby ? ?In the Left Lateral Decubitus position anoscopic examination revealed grade I hemorrhoids  in the all position(s), RP inflamed.  ?The anorectum was pre-medicated with 0.125% nitroglycerin ?The decision was made to band the RP internal hemorrhoid, and the [facility] ligator was used to perform band ligation without complication.  ?Digital anorectal examination was then performed to assure proper positioning of the band, and to adjust the banded tissue as required. ? The patient was discharged home without pain or other issues.  Dietary and behavioral recommendations were given and along with follow-up instructions.   ?  ?The following adjunctive treatments were recommended: ?Start Citrucel once daily ? ?The patient will return in 2-4 weeks for  follow-up and possible additional banding as required. ?No complications were encountered and the patient tolerated the procedure well. ? ?Harlin Rain, MD ?[facility] ? ?

## 2022-06-13 DIAGNOSIS — M2669 Other specified disorders of temporomandibular joint: Secondary | ICD-10-CM

## 2022-07-18 DIAGNOSIS — M2669 Other specified disorders of temporomandibular joint: Secondary | ICD-10-CM

## 2022-09-16 DIAGNOSIS — R102 Pelvic and perineal pain: Secondary | ICD-10-CM

## 2022-09-16 DIAGNOSIS — K640 First degree hemorrhoids: Secondary | ICD-10-CM | POA: Diagnosis not present

## 2022-09-16 DIAGNOSIS — G8929 Other chronic pain: Secondary | ICD-10-CM

## 2022-09-16 DIAGNOSIS — R1031 Right lower quadrant pain: Secondary | ICD-10-CM | POA: Diagnosis not present

## 2022-09-16 DIAGNOSIS — K6289 Other specified diseases of anus and rectum: Secondary | ICD-10-CM

## 2022-09-16 DIAGNOSIS — Z800 Family history of malignant neoplasm of digestive organs: Secondary | ICD-10-CM

## 2022-09-16 MED ORDER — NON FORMULARY
1 refills | Status: AC
Start: 2022-09-16 — End: ?

## 2022-09-16 NOTE — Patient Instructions (Addendum)
_______________________________________________________  If you are age 68 or older, your body mass index should be between 23-30. Your Body mass index is 32.5 kg/m. If this is out of the aforementioned range listed, please consider follow up with your Primary Care Provider.  If you are age 42 or younger, your body mass index should be between 19-25. Your Body mass index is 32.5 kg/m. If this is out of the aformentioned range listed, please consider follow up with your Primary Care Provider.   ________________________________________________________  The [facility] providers would like to encourage you to use [facility] to communicate with providers for non-urgent requests or questions.  Due to long hold times on the telephone, sending your provider a message by [facility] may be a faster and more efficient way to get a response.  Please allow 48 business hours for a response.  Please remember that this is for non-urgent requests.  _______________________________________________________  A referral has been sent to [facility]. Please call them in 2 weeks if they havent contacted you for an appointment. Number is [phone]  Your provider has requested that you go to the basement level for lab work before leaving today. Press "B" on the elevator. The lab is located at the first door on the left as you exit the elevator.   Anal Fissure, Adult  Diltiazem/lidocaine 3 x daily for 2 months sent to compound pharmacy  NTG 1.25% use gloved hand, apply pea size amount every 6-8 hours for pain rectally, #30 gram no refills.    Sent this medication to a compound pharmacy: You have been given script as well  [facility] [address] [place], [place], [place] [address]  [phone]  Please DO NOT go directly from our office to pick up this medication! Give the pharmacy 1 day to process the prescription. Extra time is required for them to compound your medication.  Follow up should symptoms persist  for secondary evaluation and possible surgical referral for repair.  Here some information about pelvic floor dysfunction. This may be contributing to some of your symptoms. We will continue with our evaluation but I do want you to consider adding on fiber supplement with low-dose MiraLAX daily. We could also refer to pelvic floor physical therapy.   Pelvic Floor Dysfunction, Female Pelvic floor dysfunction (PFD) is a condition that results when the group of muscles and connective tissues that support the organs in the pelvis (pelvic floor muscles) do not work well. These muscles and their connections form a sling that supports the colon and bladder. In women, they also support the uterus. PFD causes pelvic floor muscles to be too weak, too tight, or both. In PFD, muscle movements are not coordinated. This may cause bowel or bladder problems. It may also cause pain. What are the causes? This condition may be caused by an injury to the pelvic area or by a weakening of pelvic muscles. This often results from pregnancy and childbirth or other types of strain. In many cases, the exact cause is not known. What increases the risk? The following factors may make you more likely to develop this condition: Having chronic bladder tissue inflammation (interstitial cystitis). Being an older person. Being overweight. History of radiation treatment for cancer in the pelvic region. Previous pelvic surgery, such as removal of the uterus (hysterectomy). What are the signs or symptoms? Symptoms of this condition vary and may include: Bladder symptoms, such as: Trouble starting urination and emptying the bladder. Frequent urinary tract infections. Leaking urine when  coughing, laughing, or exercising (stress incontinence). Having to pass urine urgently or frequently. Pain when passing urine. Bowel symptoms, such as: Constipation. Urgent or frequent bowel movements. Incomplete bowel movements. Painful  bowel movements. Leaking stool or gas. Unexplained genital or rectal pain. Genital or rectal muscle spasms. Low back pain. Other symptoms may include: A heavy, full, or aching feeling in the vagina. A bulge that protrudes into the vagina. Pain during or after sex. How is this diagnosed? This condition may be diagnosed based on: Your symptoms and medical history. A physical exam. During the exam, your health care provider may check your pelvic muscles for tightness, spasm, pain, or weakness. This may include a rectal exam and a pelvic exam. In some cases, you may have diagnostic tests, such as: Electrical muscle function tests. Urine flow testing. X-ray tests of bowel function. Ultrasound of the pelvic organs. How is this treated? Treatment for this condition depends on the symptoms. Treatment options include: Physical therapy. This may include Kegel exercises to help relax or strengthen the pelvic floor muscles. Biofeedback. This type of therapy provides feedback on how tight your pelvic floor muscles are so that you can learn to control them. Internal or external massage therapy. A treatment that involves electrical stimulation of the pelvic floor muscles to help control pain (transcutaneous electrical nerve stimulation, or TENS). Sound wave therapy (ultrasound) to reduce muscle spasms. Medicines, such as: Muscle relaxants. Bladder control medicines. Surgery to reconstruct or support pelvic floor muscles may be an option if other treatments do not help. Follow these instructions at home: Activity Do your usual activities as told by your health care provider. Ask your health care provider if you should modify any activities. Do pelvic floor strengthening or relaxing exercises at home as told by your physical therapist. Lifestyle Maintain a healthy weight. Eat foods that are high in fiber, such as beans, whole grains, and fresh fruits and vegetables. Limit foods that are high in  fat and processed sugars, such as fried or sweet foods. Manage stress with relaxation techniques such as yoga or meditation. General instructions If you have problems with leakage: Use absorbable pads or wear padded underwear. Wash frequently with mild soap. Keep your genital and anal area as clean and dry as possible. Ask your health care provider if you should try a barrier cream to prevent skin irritation. Take warm baths to relieve pelvic muscle tension or spasms. Take over-the-counter and prescription medicines only as told by your health care provider. Keep all follow-up visits. How is this prevented? The cause of PFD is not always known, but there are a few things you can do to reduce the risk of developing this condition, including: Staying at a healthy weight. Getting regular exercise. Managing stress. Contact a health care provider if: Your symptoms are not improving with home care. You have signs or symptoms of PFD that get worse at home. You develop new signs or symptoms. You have signs of a urinary tract infection, such as: Fever. Chills. Increased urinary frequency. A burning feeling when urinating. You have not had a bowel movement in 3 days (constipation). Summary Pelvic floor dysfunction results when the muscles and connective tissues in your pelvic floor do not work well. These muscles and their connections form a sling that supports your colon and bladder. In women, they also support the uterus. PFD may be caused by an injury to the pelvic area or by a weakening of pelvic muscles. PFD causes pelvic floor muscles to be too  weak, too tight, or a combination of both. Symptoms may vary from person to person. In most cases, PFD can be treated with physical therapies and medicines. Surgery may be an option if other treatments do not help. This information is not intended to replace advice given to you by your health care provider. Make sure you discuss any questions you  have with your health care provider. Document Revised: 01/24/2021 Document Reviewed: 01/24/2021 Elsevier Patient Education  2022 [employer].

## 2022-09-16 NOTE — Telephone Encounter (Signed)
Referral placed to [facility]. Faxed to [phone]. Called [phone] and was told to call [phone] and they gave me that fax number

## 2022-09-17 NOTE — Progress Notes (Signed)
Agree with assessment and plan as outlined.  

## 2022-09-20 DIAGNOSIS — K6289 Other specified diseases of anus and rectum: Secondary | ICD-10-CM

## 2023-05-26 DIAGNOSIS — K6289 Other specified diseases of anus and rectum: Secondary | ICD-10-CM

## 2023-05-26 DIAGNOSIS — K641 Second degree hemorrhoids: Secondary | ICD-10-CM

## 2023-05-26 DIAGNOSIS — K623 Rectal prolapse: Secondary | ICD-10-CM

## 2023-05-28 NOTE — Telephone Encounter (Signed)
Colonoscopy 2021, recall 5 years, history of hemorrhoids with hemorrhoidectomy and banding in the past, now with possible rectal prolapse.  Has been seeing pelvic PT.  Will set up with Dr. Adela Lank for possible banding and put in referral to colorectal surgery for evaluation.  Continue with PT, squatty potty, keep stool soft.

## 2023-06-05 DIAGNOSIS — M6289 Other specified disorders of muscle: Secondary | ICD-10-CM

## 2023-06-17 NOTE — Telephone Encounter (Signed)
Pt called to cancel appt for this afternoon, her husband is currently on Hospice and she has no one to sit with him. She rescheduled to 07/11/23 at 2:30pm.

## 2023-12-04 IMAGING — CT CT ABD-PELV W/ CM
2 of 5 series · 16 of 46 positions shown, 18 images · IV contrast (omnipaque)
Comparison: None.

CLINICAL DATA: Chronic lower abdominal and pelvic pain.
Postmenopausal female.

EXAM:
CT ABDOMEN AND PELVIS WITH CONTRAST
TECHNIQUE: Multidetector CT imaging of the abdomen and pelvis was performed
using the standard protocol following bolus administration of
intravenous contrast.
CONTRAST:  100mL OMNIPAQUE IOHEXOL 300 MG/ML  SOLN

[Series 2: abd/pel w · axial · 0.71mm/px · z∈[-433,-58]mm · 13 of 85 slices shown, 15 images]
[im 5/85  soft-tissue]
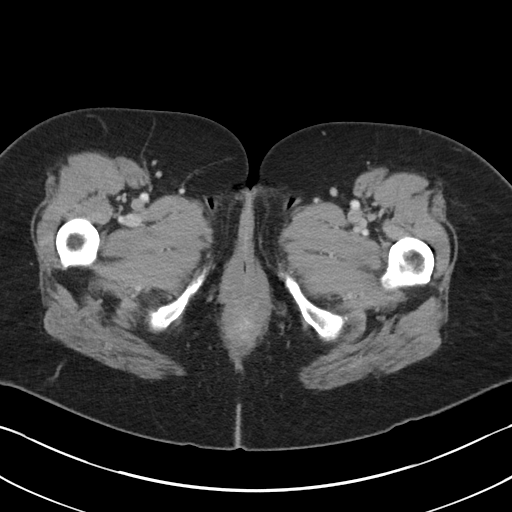
[im 5/85  bone]
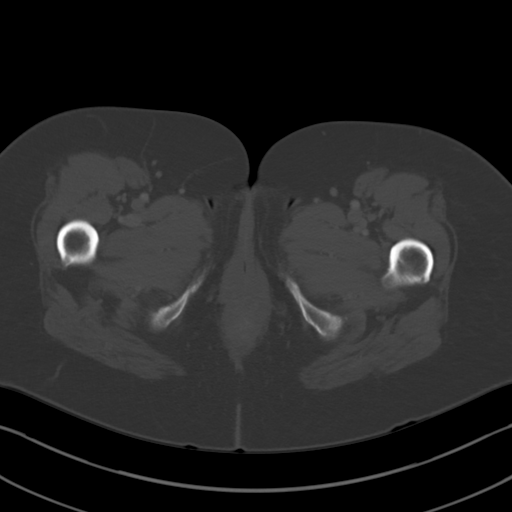
[im 10/85  soft-tissue]
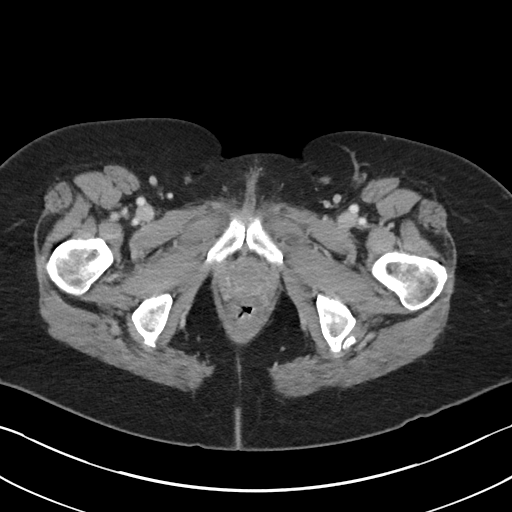
[im 19/85  soft-tissue]
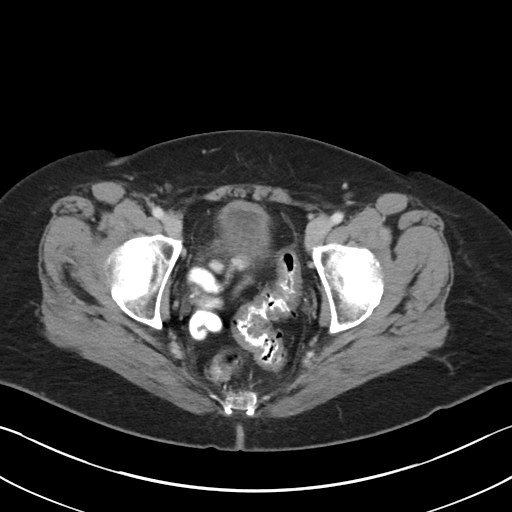
[im 24/85  soft-tissue]
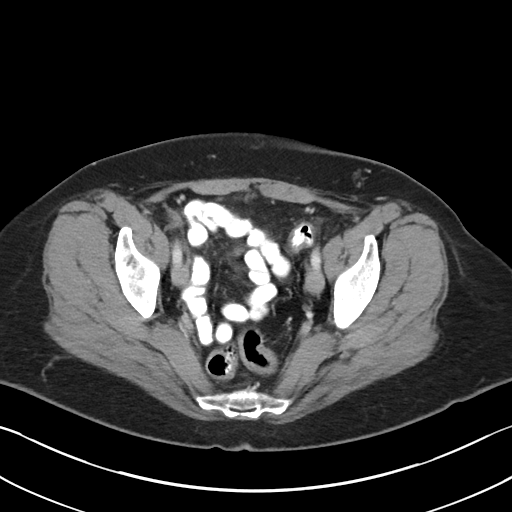
[im 29/85  soft-tissue]
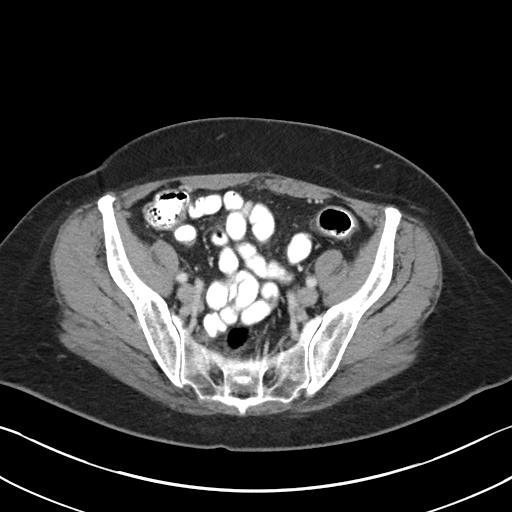
[im 38/85  soft-tissue]
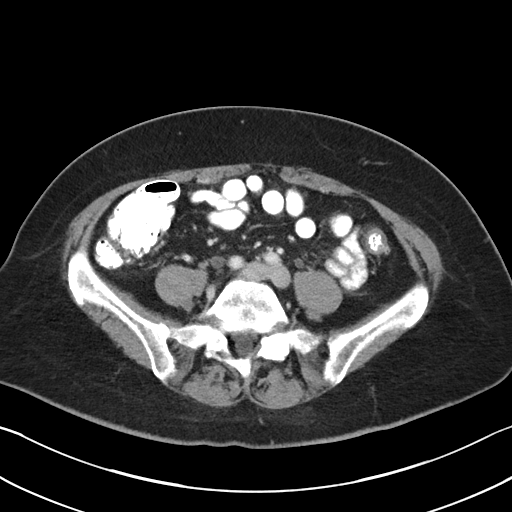
[im 43/85  soft-tissue]
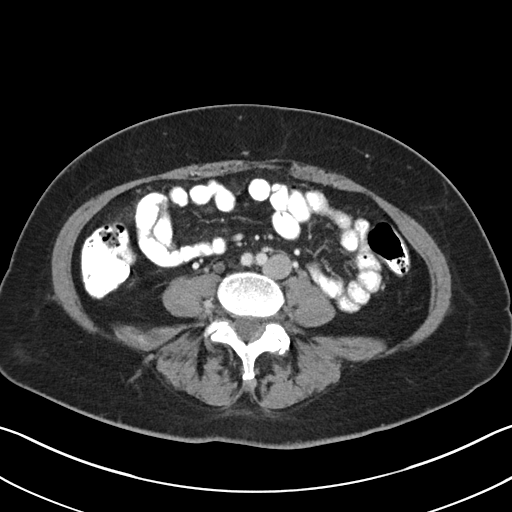
[im 47/85  soft-tissue]
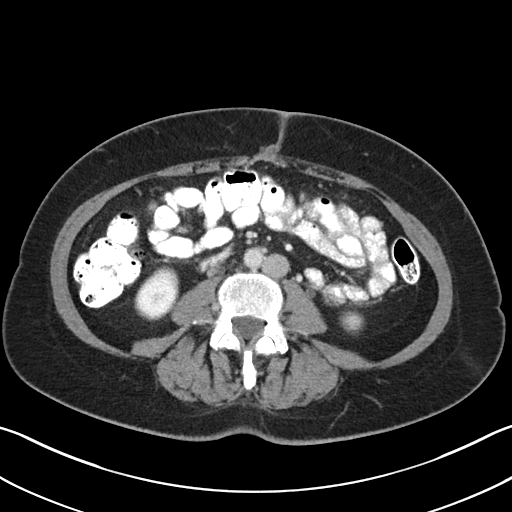
[im 57/85  soft-tissue]
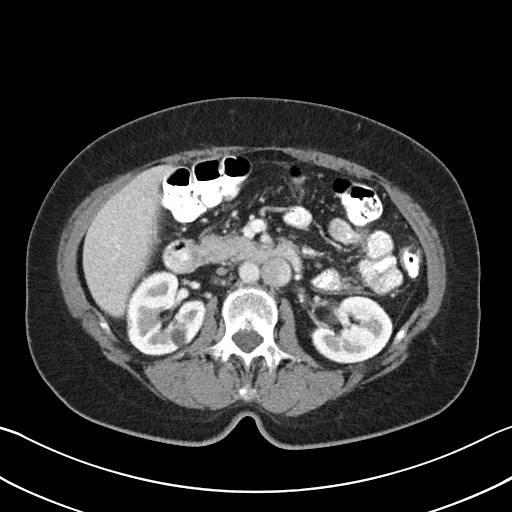
[im 57/85  bone]
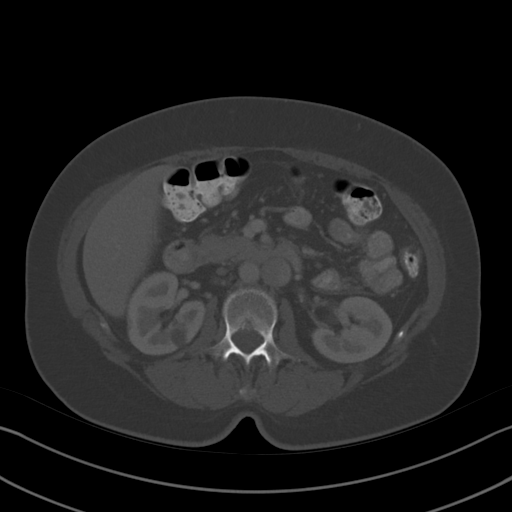
[im 61/85  soft-tissue]
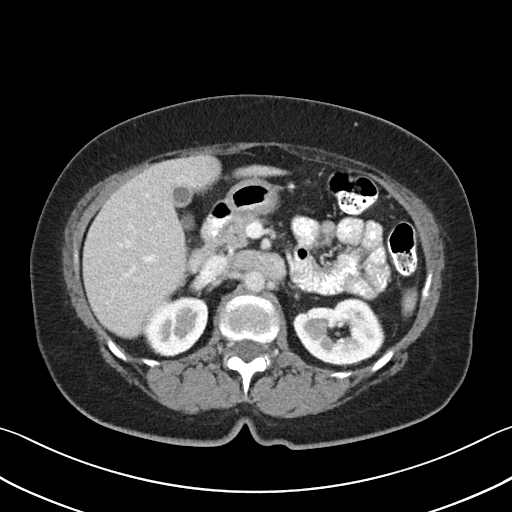
[im 66/85  soft-tissue]
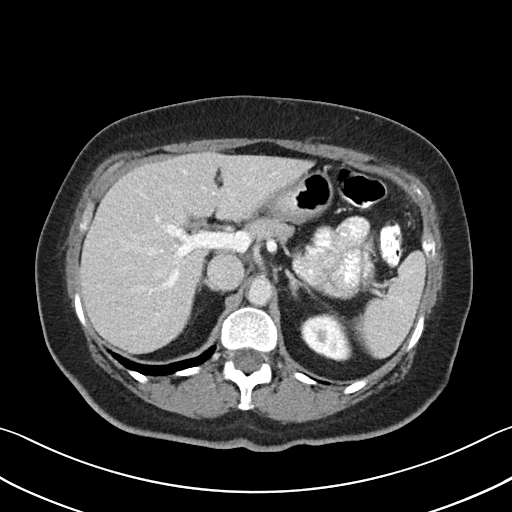
[im 75/85  soft-tissue]
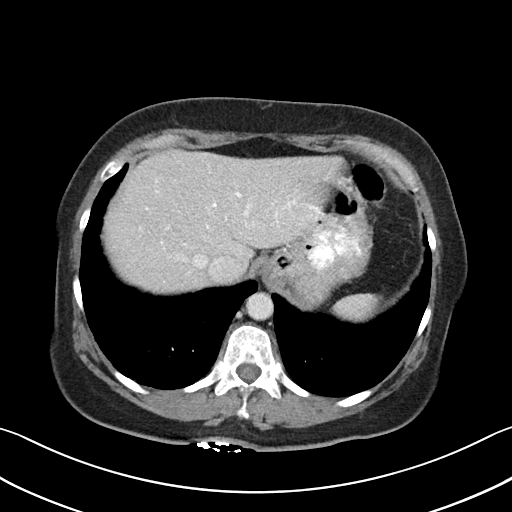
[im 80/85  soft-tissue]
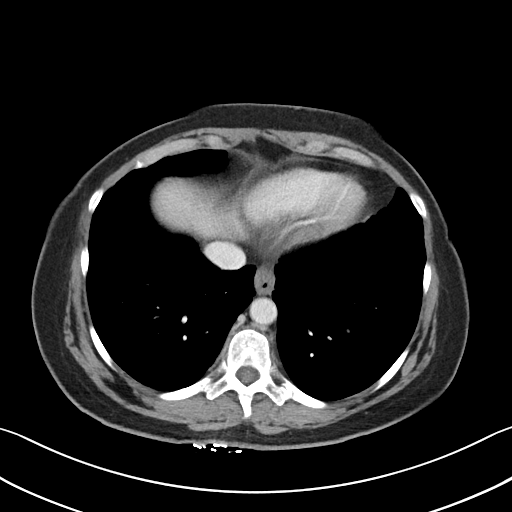

[Series 5: coronal st · coronal · 0.65mm/px · 3 of 78 slices shown]
[im 26/78  soft-tissue]
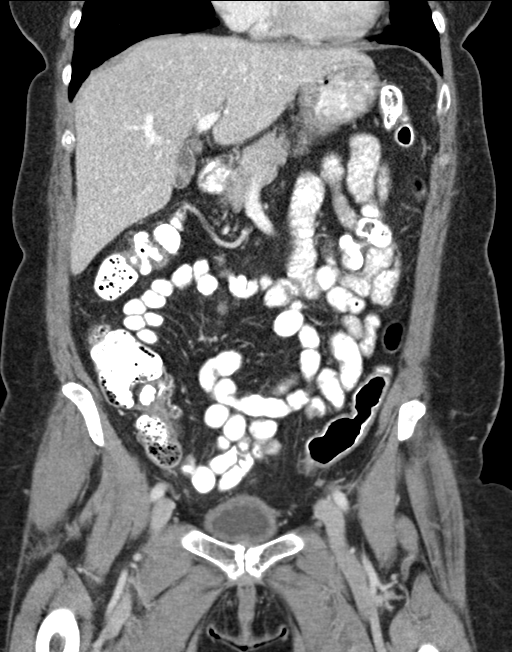
[im 35/78  soft-tissue]
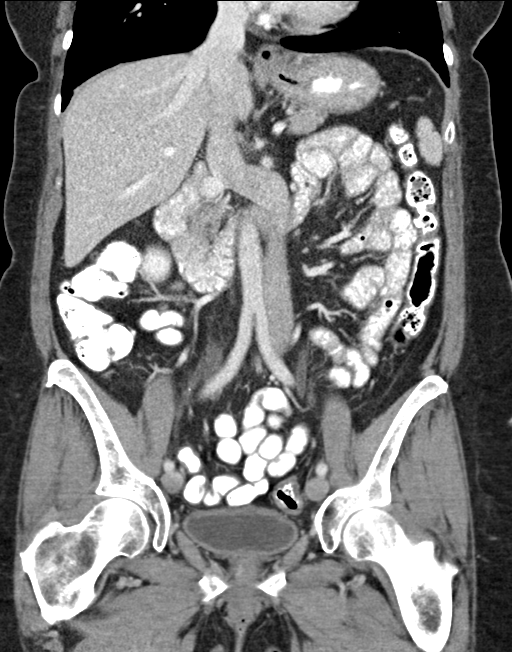
[im 43/78  soft-tissue]
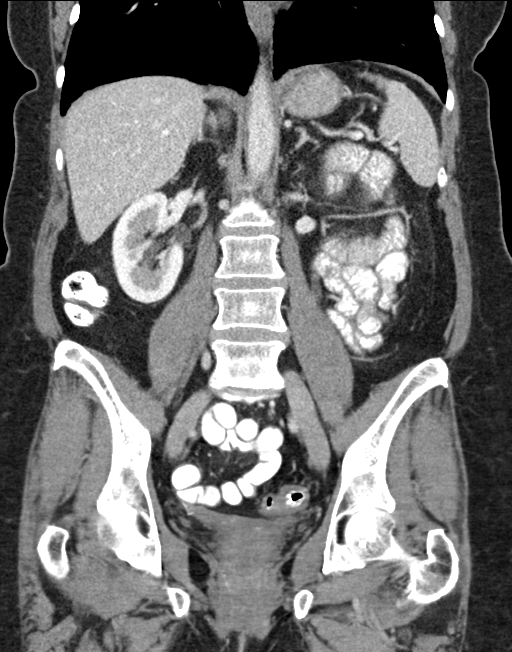

[16 of 46 positions shown; findings below may reference images not displayed]

FINDINGS: Lower Chest: No acute findings.

Hepatobiliary: No hepatic masses identified. Focal fatty
infiltration noted adjacent to the falciform ligament. Gallbladder
is unremarkable. No evidence of biliary ductal dilatation.

Pancreas:  No mass or inflammatory changes.

Spleen: Within normal limits in size and appearance.

Adrenals/Urinary Tract: No masses identified. Small simple right
renal cyst noted. No evidence of ureteral calculi or hydronephrosis.

Stomach/Bowel: No evidence of obstruction, inflammatory process or
abnormal fluid collections. Mild diverticulosis is seen mainly
involving the sigmoid colon, however there is no evidence of
diverticulitis.

Vascular/Lymphatic: No pathologically enlarged lymph nodes. No acute
vascular findings. Congenital left-sided IVC is incidentally noted,
which is a normal anatomic variant.

Reproductive: Prior hysterectomy noted. Adnexal regions are
unremarkable in appearance.

Other:  None.

Musculoskeletal: No suspicious bone lesions identified. Unilateral
right-sided L5 pars defect is seen as well severe degenerative disc
disease at L5-S1. No associated spondylolisthesis.
IMPRESSION: Mild sigmoid diverticulosis. No radiographic evidence of
diverticulitis or other acute findings.
